# Patient Record
Sex: Male | Born: 2000 | Hispanic: Refuse to answer | Marital: Single | Smoking: Current every day smoker
Health system: Southern US, Community
[De-identification: ages and names within clinical notes are randomized; demographics above are authoritative.]

## PROBLEM LIST (undated history)

## (undated) DIAGNOSIS — H7291 Unspecified perforation of tympanic membrane, right ear: Principal | ICD-10-CM

## (undated) DIAGNOSIS — M9251 Juvenile osteochondrosis of tibia and fibula, right leg: Secondary | ICD-10-CM

## (undated) DIAGNOSIS — S82891A Other fracture of right lower leg, initial encounter for closed fracture: Secondary | ICD-10-CM

## (undated) DIAGNOSIS — H6691 Otitis media, unspecified, right ear: Secondary | ICD-10-CM

## (undated) DIAGNOSIS — J189 Pneumonia, unspecified organism: Secondary | ICD-10-CM

## (undated) DIAGNOSIS — J4520 Mild intermittent asthma, uncomplicated: Secondary | ICD-10-CM

## (undated) HISTORY — DX: Unspecified perforation of tympanic membrane, right ear: H72.91

## (undated) HISTORY — DX: Pneumonia, unspecified organism: J18.9

## (undated) HISTORY — DX: Otitis media, unspecified, right ear: H66.91

## (undated) HISTORY — DX: Other fracture of right lower leg, initial encounter for closed fracture: S82.891A

## (undated) HISTORY — DX: Juvenile osteochondrosis of tibia and fibula, right leg: M92.51

## (undated) HISTORY — DX: Mild intermittent asthma, uncomplicated: J45.20

---
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
## 2021-10-24 ENCOUNTER — Encounter: Payer: Self-pay | Admitting: Family Medicine

## 2021-10-24 ENCOUNTER — Ambulatory Visit (INDEPENDENT_AMBULATORY_CARE_PROVIDER_SITE_OTHER): Payer: BC Managed Care – PPO | Admitting: Family Medicine

## 2021-10-24 VITALS — BP 120/68 | HR 81 | Temp 97.2°F | Ht 68.75 in | Wt 196.0 lb

## 2021-10-24 DIAGNOSIS — Z0000 Encounter for general adult medical examination without abnormal findings: Secondary | ICD-10-CM

## 2021-10-24 DIAGNOSIS — Z13220 Encounter for screening for lipoid disorders: Secondary | ICD-10-CM

## 2021-10-24 DIAGNOSIS — Z00129 Encounter for routine child health examination without abnormal findings: Secondary | ICD-10-CM

## 2021-10-24 DIAGNOSIS — E663 Overweight: Secondary | ICD-10-CM | POA: Diagnosis not present

## 2021-10-24 DIAGNOSIS — Z131 Encounter for screening for diabetes mellitus: Secondary | ICD-10-CM

## 2021-10-24 DIAGNOSIS — Z113 Encounter for screening for infections with a predominantly sexual mode of transmission: Secondary | ICD-10-CM

## 2021-10-24 DIAGNOSIS — J4520 Mild intermittent asthma, uncomplicated: Secondary | ICD-10-CM | POA: Diagnosis not present

## 2021-10-24 DIAGNOSIS — Z1159 Encounter for screening for other viral diseases: Secondary | ICD-10-CM

## 2021-10-24 MED ORDER — ALBUTEROL SULFATE HFA 108 (90 BASE) MCG/ACT IN AERS: 2.0000 | INHALATION_SPRAY | Freq: Four times a day (QID) | RESPIRATORY_TRACT | 6 refills | Status: AC | PRN

## 2021-10-24 NOTE — Assessment & Plan Note (Addendum)
Asthma is well controlled on PRN albuterol inhaler, rare use - refilled.  AIR-Q score = 0 Peak flow = 600 L/min, 93% predicted

## 2021-10-24 NOTE — Assessment & Plan Note (Addendum)
Congratulated on 30+ lb weight loss over the past 2 years.  He stays more active.  Anticipate this has helped asthma control.

## 2021-10-24 NOTE — Patient Instructions (Signed)
Return at your convenience for fasting labs (like next Monday).  You are doing well today Congrats on weight!  Albuterol refilled but it sounds like your asthma is well controlled. Peak flow test today.  Return as needed or in 1 year for next physical.   Preventive Care 39-20 Years Old, Male Preventive care refers to lifestyle choices and visits with your health care provider that can promote health and wellness. At this stage in your life, you may start seeing a primary care physician instead of a pediatrician for your preventive care. Preventive care visits are also called wellness exams. What can I expect for my preventive care visit? Counseling During your preventive care visit, your health care provider may ask about your: Medical history, including: Past medical problems. Family medical history. Current health, including: Home life and relationship well-being. Emotional well-being. Sexual activity and sexual health. Lifestyle, including: Alcohol, nicotine or tobacco, and drug use. Access to firearms. Diet, exercise, and sleep habits. Sunscreen use. Motor vehicle safety. Physical exam Your health care provider may check your: Height and weight. These may be used to calculate your BMI (body mass index). BMI is a measurement that tells if you are at a healthy weight. Waist circumference. This measures the distance around your waistline. This measurement also tells if you are at a healthy weight and may help predict your risk of certain diseases, such as type 2 diabetes and high blood pressure. Heart rate and blood pressure. Body temperature. Skin for abnormal spots. What immunizations do I need?  Vaccines are usually given at various ages, according to a schedule. Your health care provider will recommend vaccines for you based on your age, medical history, and lifestyle or other factors, such as travel or where you work. What tests do I need? Screening Your health care  provider may recommend screening tests for certain conditions. This may include: Vision and hearing tests. Lipid and cholesterol levels. Hepatitis B test. Hepatitis C test. HIV (human immunodeficiency virus) test. STI (sexually transmitted infection) testing, if you are at risk. Tuberculosis skin test. Talk with your health care provider about your test results, treatment options, and if necessary, the need for more tests. Follow these instructions at home: Eating and drinking  Eat a healthy diet that includes fresh fruits and vegetables, whole grains, lean protein, and low-fat dairy products. Drink enough fluid to keep your urine pale yellow. Do not drink alcohol if: Your health care provider tells you not to drink. You are under the legal drinking age. In the [place] the legal drinking age is 20. If you drink alcohol: Limit how much you have to 0-2 drinks a day. Know how much alcohol is in your drink. In the [place] one drink equals one 12 oz bottle of beer (355 mL), one 5 oz glass of wine (148 mL), or one 1 oz glass of hard liquor (44 mL). Lifestyle Brush your teeth every morning and night with fluoride toothpaste. Floss one time each day. Exercise for at least 30 minutes 5 or more days of the week. Do not use any products that contain nicotine or tobacco. These products include cigarettes, chewing tobacco, and vaping devices, such as e-cigarettes. If you need help quitting, ask your health care provider. Do not use drugs. If you are sexually active, practice safe sex. Use a condom or other form of protection to prevent STIs. Find healthy ways to manage stress, such as: Meditation, yoga, or listening to music. Journaling. Talking to a trusted person. Spending time  with friends and family. Safety Always wear your seat belt while driving or riding in a vehicle. Do not drive: If you have been drinking alcohol. Do not ride with someone who has been drinking. When you are tired or  distracted. While texting. If you have been using any mind-altering substances or drugs. Wear a helmet and other protective equipment during sports activities. If you have firearms in your house, make sure you follow all gun safety procedures. Seek help if you have been bullied, physically abused, or sexually abused. Use the internet responsibly to avoid dangers, such as online bullying and online sex predators. What's next? Go to your health care provider once a year for an annual wellness visit. Ask your health care provider how often you should have your eyes and teeth checked. Stay up to date on all vaccines. This information is not intended to replace advice given to you by your health care provider. Make sure you discuss any questions you have with your health care provider. Document Revised: 10/06/2020 Document Reviewed: 10/06/2020 Elsevier Patient Education  2023 [employer].

## 2021-10-24 NOTE — Assessment & Plan Note (Signed)
Preventative protocols reviewed and updated unless pt declined. Discussed healthy diet and lifestyle.  

## 2021-10-24 NOTE — Progress Notes (Signed)
Patient ID: Jeff Mcclure, male    DOB: 12/29/2000, 21 y.o.   MRN: [id number]  This visit was conducted in person.  BP 120/68   Pulse 81   Temp (!) 97.2 F (36.2 C) (Temporal)   Ht 5' 8.75" (1.746 m)   Wt 196 lb (88.9 kg)   SpO2 97%   PF 600 L/min Comment: 1- 300, 2- 500, 3- 600  BMI 29.16 kg/m   Anticipated PF for age/height = ~640 L/min  CC: CPE  Subjective:   HPI: Jeff Mcclure is a 21 y.o. male presenting on 10/24/2021 for Annual Exam   Last seen 09/2019.   Mild intermittent asthma - managed with PRN albuterol inh/neb. No recent albuterol use. Unsure triggers. No night time awakenings.  AIR-Q score = 0!  Did 1 semester at [employer] and [employer] in [place] [employer]). Now working at [employer].   Smoking MJ daily.   30 lb weight loss! Enjoys basketball, goes to gym regularly.  New car [employer]: Flu shot yearly  COVID vaccine [employer] 07/2019 x2  Tdap 2014 Seat belt use discussed. Doesn't drive and text.  Sunscreen use discussed. No changing moles on skin.  Alcohol - none Smoking/vaping/smokeless tobacco - none Recreational drugs - MJ  Mood - denies depression.   Sex - sexually active with 1 partner in the past year.  Dentist - due  Eye exam - due  Lives with mom and grandmother Father in [place] Will attend [employer] College at [employer] football, bikes, swimming, video games. No smokers at home. Wears helmet. Favorite food - rice and beans.  Good fruits and vegetables.       Relevant past medical, surgical, family and social history reviewed and updated as indicated. Interim medical history since our last visit reviewed. Allergies and medications reviewed and updated. Outpatient Medications Prior to Visit  Medication Sig Dispense Refill   albuterol (ACCUNEB) 0.63 MG/3ML nebulizer solution INHALE 1 VIAL BY NEBULIZATION EVERY 6 (SIX) HOURS AS NEEDED. 75 mL 2   albuterol (VENTOLIN HFA) 108 (90 Base) MCG/ACT inhaler TAKE 2 PUFFS  BY MOUTH EVERY 6 HOURS AS NEEDED 8.5 each 3   benzonatate (TESSALON) 100 MG capsule Take 1 capsule (100 mg total) by mouth 3 (three) times daily as needed. 30 capsule 0   ipratropium (ATROVENT) 0.03 % nasal spray Place 2 sprays into both nostrils every 12 (twelve) hours. 30 mL 12   predniSONE (STERAPRED UNI-PAK 21 TAB) 10 MG (21) TBPK tablet 6 day taper; take as directed on package instructions 21 tablet 0   No facility-administered medications prior to visit.     Per HPI unless specifically indicated in ROS section below Review of Systems  Constitutional:  Negative for activity change, appetite change, chills, fatigue, fever and unexpected weight change.  HENT:  Negative for hearing loss.   Eyes:  Negative for visual disturbance.  Respiratory:  Positive for cough (recent cold). Negative for chest tightness, shortness of breath and wheezing.   Cardiovascular:  Negative for chest pain, palpitations and leg swelling.  Gastrointestinal:  Negative for abdominal distention, abdominal pain, blood in stool, constipation, diarrhea, nausea and vomiting.  Genitourinary:  Negative for difficulty urinating and hematuria.  Musculoskeletal:  Negative for arthralgias, myalgias and neck pain.  Skin:  Negative for rash.  Neurological:  Negative for dizziness, seizures, syncope and headaches.  Hematological:  Negative for adenopathy. Does not bruise/bleed easily.  Psychiatric/Behavioral:  Negative for dysphoric mood. The patient is not nervous/anxious.  Objective:  BP 120/68   Pulse 81   Temp (!) 97.2 F (36.2 C) (Temporal)   Ht 5' 8.75" (1.746 m)   Wt 196 lb (88.9 kg)   SpO2 97%   PF 600 L/min Comment: 1- 300, 2- 500, 3- 600  BMI 29.16 kg/m   Wt Readings from Last 3 Encounters:  10/24/21 196 lb (88.9 kg) (90 %, Z= 1.29)*  10/22/19 229 lb 4 oz (104 kg) (98 %, Z= 2.16)*  09/30/19 230 lb 2 oz (104.4 kg) (99 %, Z= 2.18)*   * Growth percentiles are based on CDC (Boys, 2-20 Years) data.       Physical Exam Vitals and nursing note reviewed.  Constitutional:      General: He is not in acute distress.    Appearance: Normal appearance. He is well-developed. He is not ill-appearing.  HENT:     Head: Normocephalic and atraumatic.     Right Ear: Hearing, tympanic membrane, ear canal and external ear normal.     Left Ear: Hearing, tympanic membrane, ear canal and external ear normal.  Eyes:     General: No scleral icterus.    Extraocular Movements: Extraocular movements intact.     Conjunctiva/sclera: Conjunctivae normal.     Pupils: Pupils are equal, round, and reactive to light.  Neck:     Thyroid: No thyroid mass or thyromegaly.  Cardiovascular:     Rate and Rhythm: Normal rate and regular rhythm.     Pulses: Normal pulses.          Radial pulses are 2+ on the right side and 2+ on the left side.     Heart sounds: Normal heart sounds. No murmur heard. Pulmonary:     Effort: Pulmonary effort is normal. No respiratory distress.     Breath sounds: Normal breath sounds. No wheezing, rhonchi or rales.  Abdominal:     General: Bowel sounds are normal. There is no distension.     Palpations: Abdomen is soft. There is no mass.     Tenderness: There is no abdominal tenderness. There is no guarding or rebound.     Hernia: No hernia is present.  Musculoskeletal:        General: Normal range of motion.     Cervical back: Normal range of motion and neck supple.     Right lower leg: No edema.     Left lower leg: No edema.  Lymphadenopathy:     Cervical: No cervical adenopathy.  Skin:    General: Skin is warm and dry.     Findings: No rash.  Neurological:     General: No focal deficit present.     Mental Status: He is alert and oriented to person, place, and time.  Psychiatric:        Mood and Affect: Mood normal.        Behavior: Behavior normal.        Thought Content: Thought content normal.        Judgment: Judgment normal.       Assessment & Plan:   Problem List  Items Addressed This Visit     Well adolescent visit - Primary (Chronic)    Preventative protocols reviewed and updated unless pt declined. Discussed healthy diet and lifestyle.       Mild intermittent asthma    Asthma is well controlled on PRN albuterol inhaler, rare use - refilled.  AIR-Q score = 0 Peak flow = 600 L/min, 93% predicted  Relevant Medications   albuterol (VENTOLIN HFA) 108 (90 Base) MCG/ACT inhaler   Overweight with body mass index (BMI) 25.0-29.9    Congratulated on 30+ lb weight loss over the past 2 years.  He stays more active.  Anticipate this has helped asthma control.       Other Visit Diagnoses     Screen for STD (sexually transmitted disease)       Relevant Orders   HIV Antibody (routine testing w rflx)   RPR   C. trachomatis/N. gonorrhoeae RNA   Need for hepatitis C screening test       Relevant Orders   Hepatitis C antibody   Lipid screening       Relevant Orders   Lipid panel   Diabetes mellitus screening       Relevant Orders   Basic metabolic panel        Meds ordered this encounter  Medications   albuterol (VENTOLIN HFA) 108 (90 Base) MCG/ACT inhaler    Sig: Inhale 2 puffs into the lungs every 6 (six) hours as needed for wheezing or shortness of breath.    Dispense:  8.5 each    Refill:  6   Orders Placed This Encounter  Procedures   C. trachomatis/N. gonorrhoeae RNA    Standing Status:   Future    Standing Expiration Date:   10/25/2022   Lipid panel    Standing Status:   Future    Standing Expiration Date:   10/25/2022   Basic metabolic panel    Standing Status:   Future    Standing Expiration Date:   10/25/2022   Hepatitis C antibody    Standing Status:   Future    Standing Expiration Date:   10/25/2022   HIV Antibody (routine testing w rflx)    Standing Status:   Future    Standing Expiration Date:   10/25/2022   RPR    Standing Status:   Future    Standing Expiration Date:   10/25/2022     Patient instructions: Return  at your convenience for fasting labs (like next Monday).  You are doing well today Congrats on weight!  Albuterol refilled but it sounds like your asthma is well controlled. Peak flow test today.  Return as needed or in 1 year for next physical.   Follow up plan: Return in about 1 year (around 10/25/2022) for annual exam, prior fasting for blood work.  Eustaquio Boyden, MD

## 2021-10-31 ENCOUNTER — Other Ambulatory Visit (INDEPENDENT_AMBULATORY_CARE_PROVIDER_SITE_OTHER): Payer: BC Managed Care – PPO

## 2021-10-31 DIAGNOSIS — Z1159 Encounter for screening for other viral diseases: Secondary | ICD-10-CM

## 2021-10-31 DIAGNOSIS — Z13220 Encounter for screening for lipoid disorders: Secondary | ICD-10-CM

## 2021-10-31 DIAGNOSIS — Z113 Encounter for screening for infections with a predominantly sexual mode of transmission: Secondary | ICD-10-CM | POA: Diagnosis not present

## 2021-10-31 DIAGNOSIS — Z131 Encounter for screening for diabetes mellitus: Secondary | ICD-10-CM | POA: Diagnosis not present

## 2021-10-31 LAB — BASIC METABOLIC PANEL
BUN: 15 mg/dL (ref 6–23)
CO2: 28 mEq/L (ref 19–32)
Calcium: 9.7 mg/dL (ref 8.4–10.5)
Chloride: 102 mEq/L (ref 96–112)
Creatinine, Ser: 0.96 mg/dL (ref 0.40–1.50)
GFR: 114.23 mL/min (ref >60.00)
Glucose, Bld: 81 mg/dL (ref 70–99)
Potassium: 3.5 mEq/L (ref 3.5–5.1)
Sodium: 139 mEq/L (ref 135–145)

## 2021-10-31 LAB — LIPID PANEL
Cholesterol: 154 mg/dL (ref 0–200)
HDL: 55.9 mg/dL (ref >39.00)
LDL Cholesterol: 83 mg/dL (ref 0–99)
NonHDL: 98.17
Total CHOL/HDL Ratio: 3
Triglycerides: 75 mg/dL (ref 0.0–149.0)
VLDL: 15 mg/dL (ref 0.0–40.0)

## 2021-11-01 LAB — HEPATITIS C ANTIBODY: Hepatitis C Ab: NONREACTIVE

## 2021-11-01 LAB — C. TRACHOMATIS/N. GONORRHOEAE RNA
C. trachomatis RNA, TMA: NOT DETECTED
N. gonorrhoeae RNA, TMA: NOT DETECTED

## 2021-11-01 LAB — RPR: RPR Ser Ql: NONREACTIVE

## 2021-11-01 LAB — HIV ANTIBODY (ROUTINE TESTING W REFLEX): HIV 1&2 Ab, 4th Generation: NONREACTIVE

## 2021-11-07 ENCOUNTER — Emergency Department
Admission: EM | Admit: 2021-11-07 | Discharge: 2021-11-07 | Disposition: A | Discharge status: Home / Self Care | Payer: BC Managed Care – PPO | Attending: Emergency Medicine | Admitting: Emergency Medicine

## 2021-11-07 ENCOUNTER — Other Ambulatory Visit: Payer: Self-pay

## 2021-11-07 DIAGNOSIS — Z7951 Long term (current) use of inhaled steroids: Secondary | ICD-10-CM | POA: Insufficient documentation

## 2021-11-07 DIAGNOSIS — J029 Acute pharyngitis, unspecified: Secondary | ICD-10-CM | POA: Diagnosis present

## 2021-11-07 DIAGNOSIS — R0981 Nasal congestion: Secondary | ICD-10-CM | POA: Diagnosis not present

## 2021-11-07 DIAGNOSIS — J4520 Mild intermittent asthma, uncomplicated: Secondary | ICD-10-CM | POA: Insufficient documentation

## 2021-11-07 DIAGNOSIS — J039 Acute tonsillitis, unspecified: Secondary | ICD-10-CM | POA: Insufficient documentation

## 2021-11-07 LAB — GROUP A STREP BY PCR: Group A Strep by PCR: NOT DETECTED

## 2021-11-07 MED ORDER — CLINDAMYCIN HCL 300 MG PO CAPS: 300.0000 mg | ORAL_CAPSULE | Freq: Three times a day (TID) | ORAL | 0 refills | Status: DC

## 2021-11-07 MED ORDER — DEXAMETHASONE 10 MG/ML FOR PEDIATRIC ORAL USE
10.0000 mg | Freq: Once | INTRAMUSCULAR | Status: AC
Start: 2021-11-07 — End: 2021-11-07
  Administered 2021-11-07: 10 mg via ORAL
  Filled 2021-11-07: qty 1

## 2021-11-07 MED ORDER — MAGIC MOUTHWASH: ORAL | 0 refills | Status: DC

## 2021-11-07 MED ORDER — CLINDAMYCIN HCL 150 MG PO CAPS
300.0000 mg | ORAL_CAPSULE | Freq: Once | ORAL | Status: AC
Start: 2021-11-07 — End: 2021-11-07
  Administered 2021-11-07: 300 mg via ORAL
  Filled 2021-11-07: qty 2

## 2021-11-07 MED ORDER — MAGIC MOUTHWASH
10.0000 mL | Freq: Once | ORAL | Status: AC
  Administered 2021-11-07: 10 mL via ORAL
  Filled 2021-11-07: qty 10

## 2021-11-07 MED ORDER — ACETAMINOPHEN 325 MG PO TABS
650.0000 mg | ORAL_TABLET | Freq: Once | ORAL | Status: AC | PRN
  Administered 2021-11-07: 650 mg via ORAL
  Filled 2021-11-07: qty 2

## 2021-11-07 NOTE — Discharge Instructions (Signed)
1.  Take antibiotic as prescribed (Clindamycin 300mg   3 times daily x10 days). 2.  You may use Magic mouthwash as needed for throat discomfort. 3.  Return to the ER for worsening symptoms, persistent vomiting, difficulty breathing or other concerns.

## 2021-11-07 NOTE — ED Triage Notes (Signed)
Patient reports three days of sore throat and nasal congestion. Denies fever or chills, denies body aches. Ambulatory to triage, resp even, unlabored on RA.

## 2021-11-07 NOTE — ED Provider Notes (Signed)
[facility] Provider Note    Event Date/Time   First MD Initiated Contact with Patient 11/07/21 [phone]     (approximate)   History   Sore Throat   HPI  Jeff Mcclure is a 21 y.o. male who presents to the ED from home with a chief complaint of sore throat and nasal congestion x3 days.  Denies fever, chills, cough, chest pain, shortness of breath, abdominal pain, nausea, vomiting or dizziness.  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 Authorizing Provider  clindamycin (CLEOCIN) 300 MG capsule Take 1 capsule (300 mg total) by mouth 3 (three) times daily. 11/07/21  Yes Irean Hong, MD  magic mouthwash SOLN 30mL Anbesol 2mL Benadryl 38mL Mylanta  66mL swish, gargle & spit q8hr prn throat discomfort 11/07/21  Yes Irean Hong, MD  albuterol (ACCUNEB) 0.63 MG/3ML nebulizer solution INHALE 1 VIAL BY NEBULIZATION EVERY 6 (SIX) HOURS AS NEEDED.  09/30/19   Eustaquio Boyden, MD  albuterol (VENTOLIN HFA) 108 (90 Base) MCG/ACT inhaler Inhale 2 puffs into the lungs every 6 (six) hours as needed for wheezing or shortness of breath. 10/24/21   Eustaquio Boyden, MD     Allergies  Cinnamomum aromaticum [cinnamon], Aspirin, and Penicillins   Family History   Family History  Problem Relation Age of Onset   Asthma Mother    Hypothyroidism Mother    Hypertension Mother    Hyperlipidemia Mother    Diabetes Mother        borderline   Cancer Maternal Grandmother        thyroid   Coronary artery disease Neg Hx    Stroke Neg Hx      Physical Exam  Triage Vital Signs: ED Triage Vitals [11/07/21 0212]  Enc Vitals Group     BP 134/70     Pulse Rate 95     Resp 20     Temp 98.3 F (36.8 C)     Temp Source Oral     SpO2 97 %     Weight 196 lb (88.9 kg)     Height 5\' 8"  (1.727 m)     Head Circumference      Peak Flow      Pain Score 10     Pain Loc      Pain Edu?      Excl. in GC?  Updated Vital Signs: BP 134/70 (BP Location: Left Arm)   Pulse 95   Temp 98.3 F (36.8 C) (Oral)   Resp 20   Ht 5\' 8"  (1.727 m)   Wt 88.9 kg   SpO2 97%   BMI 29.80 kg/m    General: Awake, no distress.  CV:  Good peripheral perfusion.  Resp:  Normal effort.  Abd:  No distention.  Other:  Moderately erythematous oropharynx with symmetrical and bilateral tonsillar swelling without exudates or peritonsillar abscess.  There is no hoarse or muffled voice.  There is no drooling.  Shotty anterior lymphadenopathy.  Supple neck without meningismus.  No rash.   ED Results / Procedures / Treatments  Labs (all labs ordered are listed, but only abnormal results are displayed) Labs Reviewed  GROUP A STREP BY PCR     EKG  None   RADIOLOGY None   Official radiology report(s): No results found.   PROCEDURES:  Critical Care performed: No  Procedures   MEDICATIONS ORDERED IN ED: Medications  dexamethasone (DECADRON) 10 MG/ML  injection for Pediatric ORAL use 10 mg (has no administration in time range)  clindamycin (CLEOCIN) capsule 300 mg (has no administration in time range)  magic mouthwash (has no administration in time range)  acetaminophen (TYLENOL) tablet 650 mg (650 mg Oral Given 11/07/21 0217)     IMPRESSION / MDM / ASSESSMENT AND PLAN / ED COURSE  I reviewed the triage vital signs and the nursing notes.                             21 year old male presenting with sore throat.  Rapid strep is negative.  Clinically patient has tonsillitis.  Will start clindamycin, Decadron x1 dose in the ED, Magic mouthwash as needed.  Strict return precautions given.  Patient verbalizes understanding and agrees with plan of care.  Patient's presentation is most consistent with acute, uncomplicated illness.   FINAL CLINICAL IMPRESSION(S) / ED DIAGNOSES   Final diagnoses:  Sore throat  Tonsillitis     Rx / DC Orders   ED Discharge Orders          Ordered    clindamycin (CLEOCIN) 300 MG capsule  3 times daily        11/07/21 0532    magic mouthwash SOLN        11/07/21 0532             Note:  This document was prepared using Dragon voice recognition software and may include unintentional dictation errors.   11/09/21, MD 11/07/21 [phone]

## 2021-11-21 ENCOUNTER — Encounter: Payer: Self-pay | Admitting: Family Medicine

## 2021-11-21 ENCOUNTER — Ambulatory Visit (INDEPENDENT_AMBULATORY_CARE_PROVIDER_SITE_OTHER): Payer: BC Managed Care – PPO | Admitting: Family Medicine

## 2021-11-21 DIAGNOSIS — J039 Acute tonsillitis, unspecified: Secondary | ICD-10-CM | POA: Diagnosis not present

## 2021-11-21 NOTE — Progress Notes (Signed)
Patient ID: Jeff Mcclure, male    DOB: 29-Jun-2000, 21 y.o.   MRN: [id number]  This visit was conducted in person.  BP 120/74   Pulse 64   Temp (!) 97.3 F (36.3 C) (Temporal)   Ht 5\' 8"  (1.727 m)   Wt 198 lb 6 oz (90 kg)   SpO2 98%   BMI 30.16 kg/m    CC: ER f/u visit  Subjective:   HPI: Jeff Mcclure is a 21 y.o. male presenting on 11/21/2021 for Hospitalization Follow-up (Seen at 11/07/21 at [facility] ED, dx ST; tonsillitis. )   Recent ER visit 11/07/2021 for ST with nasal congestion. Was very painful to swallow.  Had bilateral tonsillar swelling, shotty AC LAD, RST negative. Dx tonsillitis, treated with decadron x1 in ER as well as clindamycin 10d course. Also received magic mouthwash.   He didn't complete clindamycin course as he vomited x2 on 3rd day. Only took 3 days of antibiotic.  Symptoms have fully resolved.  Asthma well controlled. No fever, no further ST, cough or congestion.      Relevant past medical, surgical, family and social history reviewed and updated as indicated. Interim medical history since our last visit reviewed. Allergies and medications reviewed and updated. Outpatient Medications Prior to Visit  Medication Sig Dispense Refill   albuterol (ACCUNEB) 0.63 MG/3ML nebulizer solution INHALE 1 VIAL BY NEBULIZATION EVERY 6 (SIX) HOURS AS NEEDED. 75 mL 2   albuterol (VENTOLIN HFA) 108 (90 Base) MCG/ACT inhaler Inhale 2 puffs into the lungs every 6 (six) hours as needed for wheezing or shortness of breath. 8.5 each 6   magic mouthwash SOLN 48mL Anbesol 34mL Benadryl 61mL Mylanta  13mL swish, gargle & spit q8hr prn throat discomfort 72 mL 0   clindamycin (CLEOCIN) 300 MG capsule Take 1 capsule (300 mg total) by mouth 3 (three) times daily. 30 capsule 0   No facility-administered medications prior to visit.     Per HPI unless specifically indicated in ROS section below Review of Systems  Objective:  BP 120/74   Pulse 64   Temp (!) 97.3 F (36.3 C)  (Temporal)   Ht 5\' 8"  (1.727 m)   Wt 198 lb 6 oz (90 kg)   SpO2 98%   BMI 30.16 kg/m   Wt Readings from Last 3 Encounters:  11/21/21 198 lb 6 oz (90 kg)  11/07/21 196 lb (88.9 kg) (90 %, Z= 1.29)*  10/24/21 196 lb (88.9 kg) (90 %, Z= 1.29)*   * Growth percentiles are based on CDC (Boys, 2-20 Years) data.      Physical Exam Vitals and nursing note reviewed.  Constitutional:      Appearance: Normal appearance. He is not ill-appearing.  HENT:     Head: Normocephalic and atraumatic.     Right Ear: Hearing, tympanic membrane, ear canal and external ear normal. There is no impacted cerumen.     Left Ear: Hearing, tympanic membrane, ear canal and external ear normal. There is no impacted cerumen.     Nose: Nose normal. No mucosal edema or rhinorrhea.     Right Turbinates: Not enlarged or swollen.     Left Turbinates: Not enlarged or swollen.     Right Sinus: No maxillary sinus tenderness or frontal sinus tenderness.     Left Sinus: No maxillary sinus tenderness or frontal sinus tenderness.     Mouth/Throat:     Mouth: Mucous membranes are moist.     Pharynx: Oropharynx is clear. No oropharyngeal  exudate or posterior oropharyngeal erythema.  Eyes:     Extraocular Movements: Extraocular movements intact.     Conjunctiva/sclera: Conjunctivae normal.     Pupils: Pupils are equal, round, and reactive to light.  Cardiovascular:     Rate and Rhythm: Normal rate and regular rhythm.     Pulses: Normal pulses.     Heart sounds: Normal heart sounds. No murmur heard. Pulmonary:     Effort: Pulmonary effort is normal. No respiratory distress.     Breath sounds: Normal breath sounds. No wheezing, rhonchi or rales.  Musculoskeletal:     Cervical back: Normal range of motion and neck supple. No rigidity.     Right lower leg: No edema.     Left lower leg: No edema.  Lymphadenopathy:     Cervical: No cervical adenopathy.  Skin:    General: Skin is warm and dry.     Findings: No rash.   Neurological:     Mental Status: He is alert.  Psychiatric:        Mood and Affect: Mood normal.        Behavior: Behavior normal.       Results for orders placed or performed during the hospital encounter of 11/07/21  Group A Strep by PCR   Specimen: Throat; Sterile Swab  Result Value Ref Range   Group A Strep by PCR NOT DETECTED NOT DETECTED    Assessment & Plan:   Problem List Items Addressed This Visit     Tonsillitis    Treated for strep by ER with decadron and clindamycin, but only took 3 days worth of antibiotic. Symptoms have largely resolved.         No orders of the defined types were placed in this encounter.  No orders of the defined types were placed in this encounter.    Patient Instructions  You are doing well today. Let us know if recurrent symptoms.   Follow up plan: Return if symptoms worsen or fail to improve.  Jeff Boyden, MD

## 2021-11-21 NOTE — Patient Instructions (Addendum)
You are doing well today. Let us know if recurrent symptoms.

## 2021-11-21 NOTE — Assessment & Plan Note (Signed)
Treated for strep by ER with decadron and clindamycin, but only took 3 days worth of antibiotic. Symptoms have largely resolved.

## 2022-06-06 ENCOUNTER — Ambulatory Visit (INDEPENDENT_AMBULATORY_CARE_PROVIDER_SITE_OTHER): Payer: BC Managed Care – PPO | Admitting: Family Medicine

## 2022-06-06 ENCOUNTER — Encounter: Payer: Self-pay | Admitting: Family Medicine

## 2022-06-06 VITALS — BP 104/68 | HR 96 | Temp 97.1°F | Ht 68.0 in | Wt 206.4 lb

## 2022-06-06 DIAGNOSIS — U071 COVID-19: Secondary | ICD-10-CM | POA: Diagnosis not present

## 2022-06-06 DIAGNOSIS — R051 Acute cough: Secondary | ICD-10-CM | POA: Diagnosis not present

## 2022-06-06 HISTORY — DX: COVID-19: U07.1

## 2022-06-06 LAB — POCT INFLUENZA A/B
Influenza A, POC: NEGATIVE
Influenza B, POC: NEGATIVE

## 2022-06-06 LAB — POC COVID19 BINAXNOW: SARS Coronavirus 2 Ag: POSITIVE — AB

## 2022-06-06 MED ORDER — FLUTICASONE PROPIONATE 50 MCG/ACT NA SUSP: 2.0000 | Freq: Every day | NASAL | 1 refills | Status: DC

## 2022-06-06 NOTE — Progress Notes (Signed)
Subjective:    Patient ID: Jeff Mcclure, male    DOB: 02/01/2001, 22 y.o.   MRN: [id number]  HPI 22 yo pf of Dr Darnell Level presents with uri symptoms  He is a smoker   Wt Readings from Last 3 Encounters:  06/06/22 206 lb 6 oz (93.6 kg)  11/21/21 198 lb 6 oz (90 kg)  11/07/21 196 lb (88.9 kg) (90 %, Z= 1.29)*   * Growth percentiles are based on CDC (Boys, 2-20 Years) data.   31.38 kg/m   Pos covid test today in office  Yesterday afternoon got a runny nose  This am had low grade temp -sweating , hot and cold 99.9   Slight headache Throat is fine  Ears are ok  Nasal congestion   Cough- little/not bad  Non prod   No wheezing  Has not needed his albuterol    No loss of taste or smell   Otc: Took [employer] generic multi symptom/ day quil  Used fonase in the past/not now  Does well with ibuprofen   Does not have saline   Results for orders placed or performed in visit on 06/06/22  POC COVID-19  Result Value Ref Range   SARS Coronavirus 2 Ag Positive (A) Negative  POCT Influenza A/B  Result Value Ref Range   Influenza A, POC Negative Negative   Influenza B, POC Negative Negative   Patient Active Problem List   Diagnosis Date Noted   COVID-19 06/06/2022   Keloid [id number]   Complication of ear piercing 01/09/2018   Constipation 04/12/2016   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   Tonsillitis 08/23/2015   Allergic rhinitis 06/11/2015   Overweight with body mass index (BMI) 25.0-29.9 05/12/2014   Osgood-Schlatter's disease of right knee 01/09/2013   Skin rash 05/29/2011   Well adolescent visit 12/13/2010   Mild intermittent asthma    Past Medical History:  Diagnosis Date   Acute otitis media of right ear with perforated tympanic membrane 04/21/2015   Ankle fracture, right 10/08/2012   [place] fracture of distal tibial epiphysis s/p boot   Mild intermittent asthma    Osgood-Schlatter's disease of right knee 01/09/2013   Completed [facility] PT 01/2014 with HEP  in place    Pneumonia 12/2010   Past Surgical History:  Procedure Laterality Date   TYMPANOSTOMY Bilateral 2011   Social History   Tobacco Use   Smoking status: Every Day    Types: Cigarettes    Passive exposure: Yes   Smokeless tobacco: Never   Tobacco comments:    Marijuana  Substance Use Topics   Alcohol use: No    Alcohol/week: 0.0 standard drinks of alcohol   Drug use: Yes    Types: Marijuana   Family History  Problem Relation Age of Onset   Asthma Mother    Hypothyroidism Mother    Hypertension Mother    Hyperlipidemia Mother    Diabetes Mother        borderline   Cancer Maternal Grandmother        thyroid   Coronary artery disease Neg Hx    Stroke Neg Hx    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   Aspirin Nausea And Vomiting   Penicillins Nausea And Vomiting   Current Outpatient Medications on File Prior to Visit  Medication Sig Dispense Refill   albuterol (ACCUNEB) 0.63 MG/3ML nebulizer solution INHALE 1 VIAL BY NEBULIZATION EVERY 6 (SIX) HOURS AS NEEDED. 75 mL 2   albuterol (  VENTOLIN HFA) 108 (90 Base) MCG/ACT inhaler Inhale 2 puffs into the lungs every 6 (six) hours as needed for wheezing or shortness of breath. 8.5 each 6   magic mouthwash SOLN 59m Anbesol 347mBenadryl 3071mylanta  5mL30mish, gargle & spit q8hr prn throat discomfort 72 mL 0   No current facility-administered medications on file prior to visit.     Review of Systems  Constitutional:  Positive for appetite change, fatigue and fever.       Low grade temp  HENT:  Positive for congestion, postnasal drip, rhinorrhea and sneezing. Negative for ear pain, sinus pressure, sore throat, trouble swallowing and voice change.   Eyes:  Negative for pain and discharge.  Respiratory:  Positive for cough. Negative for shortness of breath, wheezing and stridor.   Cardiovascular:  Negative for chest pain.  Gastrointestinal:  Negative for diarrhea, nausea and  vomiting.  Genitourinary:  Negative for frequency, hematuria and urgency.  Musculoskeletal:  Negative for arthralgias and myalgias.  Skin:  Negative for rash.  Neurological:  Positive for headaches. Negative for dizziness, weakness and light-headedness.  Psychiatric/Behavioral:  Negative for confusion and dysphoric mood.        Objective:   Physical Exam Constitutional:      General: He is not in acute distress.    Appearance: Normal appearance. He is well-developed. He is obese. He is not ill-appearing, toxic-appearing or diaphoretic.  HENT:     Head: Normocephalic and atraumatic.     Comments: Nares are injected and congested      Right Ear: Tympanic membrane, ear canal and external ear normal.     Left Ear: Tympanic membrane, ear canal and external ear normal.     Nose: Congestion and rhinorrhea present.     Mouth/Throat:     Mouth: Mucous membranes are moist.     Pharynx: Oropharynx is clear. No oropharyngeal exudate or posterior oropharyngeal erythema.     Comments: Clear pnd  Eyes:     General:        Right eye: No discharge.        Left eye: No discharge.     Conjunctiva/sclera: Conjunctivae normal.     Pupils: Pupils are equal, round, and reactive to light.  Cardiovascular:     Rate and Rhythm: Tachycardia present.     Heart sounds: Normal heart sounds.  Pulmonary:     Effort: Pulmonary effort is normal. No respiratory distress.     Breath sounds: Normal breath sounds. No stridor. No wheezing, rhonchi or rales.     Comments: Good air exch No wheeze even on forced exp Chest:     Chest wall: No tenderness.  Musculoskeletal:     Cervical back: Normal range of motion and neck supple.  Lymphadenopathy:     Cervical: No cervical adenopathy.  Skin:    General: Skin is warm and dry.     Capillary Refill: Capillary refill takes less than 2 seconds.     Findings: No rash.  Neurological:     Mental Status: He is alert.     Cranial Nerves: No cranial nerve deficit.   Psychiatric:        Mood and Affect: Mood normal.           Assessment & Plan:   Problem List Items Addressed This Visit       Other   COVID-19    In healthy pt with past h/o asthma (denies flare any time lately)  Disc pros/cons of  anti viral, decided against it but need to watch closely for any asthma symptoms  Disc sympt care-see AVS Disc ER precautions  Disc isolation - 5 days min or longer for symptoms (work note given) Inst to mask an addl 10 days after that   Will watch closely for asthma symptoms   Update if not starting to improve in a week or if worsening        Other Visit Diagnoses     Acute cough    -  Primary   Relevant Orders   POC COVID-19 (Completed)   POCT Influenza A/B (Completed)

## 2022-06-06 NOTE — Assessment & Plan Note (Signed)
In healthy pt with past h/o asthma (denies flare any time lately)  Disc pros/cons of anti viral, decided against it but need to watch closely for any asthma symptoms  Disc sympt care-see AVS Disc ER precautions  Disc isolation - 5 days min or longer for symptoms (work note given) Inst to mask an addl 10 days after that   Will watch closely for asthma symptoms   Update if not starting to improve in a week or if worsening

## 2022-06-06 NOTE — Patient Instructions (Addendum)
Drink fluids and rest  mucinex DM is good for cough and congestion  Nasal saline for congestion as needed  Flonase daily helps congestion also  If you have a runny nose, an antihistamine like zyrtec is helpful  Tylenol or ibuprofen (with food)  for fever or pain or headache  Please alert [place] if symptoms worsen (if severe or short of breath please go to the ER)    Let us know if you start wheezing  Use your inhaler/breathing treatment if needed   Update if not starting to improve in a week or if worsening    Isolate for minimum of 5 days (until most of symptoms are gone)  Then you need to mask for an additional 10 days when you do go back to work

## 2022-06-28 ENCOUNTER — Other Ambulatory Visit: Payer: Self-pay | Admitting: Family Medicine

## 2022-06-28 NOTE — Telephone Encounter (Signed)
Message from pharmacy:  REQUEST FOR 90 DAYS PRESCRIPTION. 

## 2022-09-04 ENCOUNTER — Encounter: Payer: Self-pay | Admitting: Family Medicine

## 2022-09-04 ENCOUNTER — Ambulatory Visit (INDEPENDENT_AMBULATORY_CARE_PROVIDER_SITE_OTHER): Payer: BC Managed Care – PPO | Admitting: Family Medicine

## 2022-09-04 VITALS — BP 112/70 | HR 63 | Temp 97.3°F | Ht 68.0 in | Wt 200.5 lb

## 2022-09-04 DIAGNOSIS — Z8349 Family history of other endocrine, nutritional and metabolic diseases: Secondary | ICD-10-CM | POA: Diagnosis not present

## 2022-09-04 DIAGNOSIS — F4321 Adjustment disorder with depressed mood: Secondary | ICD-10-CM | POA: Insufficient documentation

## 2022-09-04 LAB — TSH: TSH: 0.77 u[IU]/mL (ref 0.35–5.50)

## 2022-09-04 NOTE — Patient Instructions (Addendum)
Work on healthy stress relieving strategies - increase exercise routine.  Thyroid blood test today  We will refer you to our counselor.  Keep physical in July.  Let me know if worsening symptoms in the interim.

## 2022-09-04 NOTE — Assessment & Plan Note (Addendum)
Mother with hypothyroidism Maternal grandmother with thyroid cancer.  Check TSH

## 2022-09-04 NOTE — Progress Notes (Signed)
Ph: [phone] Fax: [phone]   Patient ID: Jeff Mcclure, male    DOB: 04/12/2001, 22 y.o.   MRN: [id number]  This visit was conducted in person.  BP 112/70   Pulse 63   Temp (!) 97.3 F (36.3 C) (Temporal)   Ht 5\' 8"  (1.727 m)   Wt 200 lb 8 oz (90.9 kg)   SpO2 99%   BMI 30.49 kg/m    CC: discuss stressors Subjective:   HPI: Jeff Mcclure is a 22 y.o. male presenting on 09/04/2022 for Personal Problem (Wants to discuss details with Dr Reece Agar. )   Several month history depressed mood, low energy, low interest anhedonia. Appetite ok, concentration ok, mild guilt. No SI/HI. Acutely worse in the past 3 wks after GF broke up with him.   Notes R eye twitching. No headache, neck pain.  He continues exercising regularly with push ups at home, going to gym infrequently.  Stays in touch with friends.  Also enjoys watching TV and listening to music.   Continues smoking MJ daily.  He's partly spoke with mom about some of this.   Continues working at [employer].  Not going to school currently.  Wants to work in a warehouse.   No known fmhx depression.  Fmhx thyroid disease.  No heat or cold intolerance, unexpected weight changes, skin or hair changes, diarrhea or constipation.      Relevant past medical, surgical, family and social history reviewed and updated as indicated. Interim medical history since our last visit reviewed. Allergies and medications reviewed and updated. Outpatient Medications Prior to Visit  Medication Sig Dispense Refill   albuterol (ACCUNEB) 0.63 MG/3ML nebulizer solution INHALE 1 VIAL BY NEBULIZATION EVERY 6 (SIX) HOURS AS NEEDED. 75 mL 2   albuterol (VENTOLIN HFA) 108 (90 Base) MCG/ACT inhaler Inhale 2 puffs into the lungs every 6 (six) hours as needed for wheezing or shortness of breath. 8.5 each 6   fluticasone (FLONASE) 50 MCG/ACT nasal spray SPRAY 2 SPRAYS INTO EACH NOSTRIL EVERY DAY 48 mL 1   magic mouthwash SOLN 12mL Anbesol 30mL  Benadryl 30mL Mylanta  5mL swish, gargle & spit q8hr prn throat discomfort 72 mL 0   No facility-administered medications prior to visit.     Per HPI unless specifically indicated in ROS section below Review of Systems  Objective:  BP 112/70   Pulse 63   Temp (!) 97.3 F (36.3 C) (Temporal)   Ht 5\' 8"  (1.727 m)   Wt 200 lb 8 oz (90.9 kg)   SpO2 99%   BMI 30.49 kg/m   Wt Readings from Last 3 Encounters:  09/04/22 200 lb 8 oz (90.9 kg)  06/06/22 206 lb 6 oz (93.6 kg)  11/21/21 198 lb 6 oz (90 kg)      Physical Exam Vitals and nursing note reviewed.  Constitutional:      Appearance: Normal appearance. He is not ill-appearing.  HENT:     Head: Normocephalic and atraumatic.     Mouth/Throat:     Mouth: Mucous membranes are moist.     Pharynx: Oropharynx is clear. No oropharyngeal exudate or posterior oropharyngeal erythema.  Eyes:     Extraocular Movements: Extraocular movements intact.     Pupils: Pupils are equal, round, and reactive to light.  Neck:     Thyroid: Thyromegaly (mild) present. No thyroid mass or thyroid tenderness.  Cardiovascular:     Rate and Rhythm: Normal rate and regular rhythm.     Pulses: Normal pulses.  Heart sounds: Normal heart sounds. No murmur heard. Pulmonary:     Effort: Pulmonary effort is normal. No respiratory distress.     Breath sounds: No rhonchi or rales.  Musculoskeletal:     Cervical back: Normal range of motion and neck supple.     Right lower leg: No edema.     Left lower leg: No edema.  Skin:    General: Skin is warm and dry.  Neurological:     Mental Status: He is alert.  Psychiatric:        Mood and Affect: Mood normal.        Behavior: Behavior normal.       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  Support provided. Reviewed healthy stress relieving strategies. Encouraged increased physical activity.  Not consistent with MDD.  Encouraged stopping MJ use or at minimum postponing until older and brain fully developed around age 22yo, reviewed substance use effect on brain development.  Will also refer to counselor.  Reassess at CPE 10/2022.       Relevant Orders   TSH   Family history of thyroid disease in mother    Mother with hypothyroidism Maternal grandmother with thyroid cancer.  Check TSH         No orders of the defined types were placed in this encounter.   Orders Placed This Encounter  Procedures   TSH    Patient Instructions  Work on healthy stress relieving strategies - increase exercise routine.  Thyroid blood test today  We will refer you to our counselor.  Keep physical in July.  Let me know if worsening symptoms in the interim.   Follow up plan: Return if symptoms worsen or fail to improve.  Eustaquio Boyden, MD

## 2022-09-04 NOTE — Assessment & Plan Note (Signed)
Difficulty dealing with recent stressors - with breakup with GF as well as dissatisfaction with job in setting of regular marijuana use.  Support provided. Reviewed healthy stress relieving strategies. Encouraged increased physical activity.  Not consistent with MDD.  Encouraged stopping MJ use or at minimum postponing until older and brain fully developed around age 22yo, reviewed substance use effect on brain development.  Will also refer to counselor.  Reassess at CPE 10/2022.

## 2022-10-30 ENCOUNTER — Ambulatory Visit (INDEPENDENT_AMBULATORY_CARE_PROVIDER_SITE_OTHER): Payer: BC Managed Care – PPO | Admitting: Family Medicine

## 2022-10-30 ENCOUNTER — Encounter: Payer: Self-pay | Admitting: Family Medicine

## 2022-10-30 VITALS — BP 118/66 | HR 75 | Temp 97.5°F | Ht 68.75 in | Wt 198.2 lb

## 2022-10-30 DIAGNOSIS — J309 Allergic rhinitis, unspecified: Secondary | ICD-10-CM | POA: Diagnosis not present

## 2022-10-30 DIAGNOSIS — Z0000 Encounter for general adult medical examination without abnormal findings: Secondary | ICD-10-CM

## 2022-10-30 DIAGNOSIS — J4520 Mild intermittent asthma, uncomplicated: Secondary | ICD-10-CM

## 2022-10-30 NOTE — Patient Instructions (Signed)
You are doing well today. Return as needed or in 1 year for next physical 

## 2022-10-30 NOTE — Assessment & Plan Note (Addendum)
Stable without recent need for rescue inhaler

## 2022-10-30 NOTE — Assessment & Plan Note (Signed)
Continue PRN flonase.

## 2022-10-30 NOTE — Assessment & Plan Note (Signed)
Preventative protocols reviewed and updated unless pt declined. Discussed healthy diet and lifestyle.  

## 2022-10-30 NOTE — Progress Notes (Signed)
Ph: [phone] Fax: [phone]   Patient ID: Jeff Mcclure, male    DOB: 2001/04/23, 22 y.o.   MRN: [id number]  This visit was conducted in person.  BP 118/66   Pulse 75   Temp (!) 97.5 F (36.4 C) (Temporal)   Ht 5' 8.75" (1.746 m)   Wt 198 lb 4 oz (89.9 kg)   SpO2 98%   BMI 29.49 kg/m    CC: CPE Subjective:   HPI: Jeff Mcclure is a 22 y.o. male presenting on 10/30/2022 for Annual Exam   Asthma - no recent albuterol inh use.  Allergic rhinitis - continues flonase PRN  Adjustment disorder - last seen 08/2022, at that time recommended cut down on MJ and discussed counselor referral. We never sent counselor referral.   Preventative: Flu shot yearly  COVID vaccine - Pfizer 07/2019 x2  Tdap 2014 Seat belt use discussed. Doesn't drive and text.  Sunscreen use discussed. No changing moles on skin.  Sleep - averaging 6.5 hours/night Alcohol - none Smoking/vaping/smokeless tobacco - none Recreational drugs - MJ - encouraged full cessation Mood - denies depression.  Sex - sexually active with 1 partner in the past year.  Dentist - due  Eye exam - has not seen  Lives with mom and grandmother Father in [place] Will attend Middle College at [employer] football, bikes, swimming, video games. No smokers at home. Wears helmet. Favorite food - rice and beans.  Good fruits and vegetables.        Relevant past medical, surgical, family and social history reviewed and updated as indicated. Interim medical history since our last visit reviewed. Allergies and medications reviewed and updated. Outpatient Medications Prior to Visit  Medication Sig Dispense Refill   albuterol (ACCUNEB) 0.63 MG/3ML nebulizer solution INHALE 1 VIAL BY NEBULIZATION EVERY 6 (SIX) HOURS AS NEEDED. 75 mL 2   albuterol (VENTOLIN HFA) 108 (90 Base) MCG/ACT inhaler Inhale 2 puffs into the lungs every 6 (six) hours as needed for wheezing or shortness of breath. 8.5 each 6   fluticasone (FLONASE) 50 MCG/ACT  nasal spray SPRAY 2 SPRAYS INTO EACH NOSTRIL EVERY DAY 48 mL 1   No facility-administered medications prior to visit.     Per HPI unless specifically indicated in ROS section below Review of Systems  Constitutional:  Negative for activity change, appetite change, chills, fatigue, fever and unexpected weight change.  HENT:  Negative for hearing loss.   Eyes:  Negative for visual disturbance.  Respiratory:  Negative for cough, chest tightness, shortness of breath and wheezing.   Cardiovascular:  Negative for chest pain, palpitations and leg swelling.  Gastrointestinal:  Positive for abdominal pain (occ central). Negative for abdominal distention, blood in stool, constipation, diarrhea, nausea and vomiting.  Genitourinary:  Negative for difficulty urinating and hematuria.  Musculoskeletal:  Negative for arthralgias, myalgias and neck pain.  Skin:  Negative for rash.  Neurological:  Negative for dizziness, seizures, syncope and headaches.  Hematological:  Negative for adenopathy. Does not bruise/bleed easily.  Psychiatric/Behavioral:  Negative for dysphoric mood. The patient is not nervous/anxious.     Objective:  BP 118/66   Pulse 75   Temp (!) 97.5 F (36.4 C) (Temporal)   Ht 5' 8.75" (1.746 m)   Wt 198 lb 4 oz (89.9 kg)   SpO2 98%   BMI 29.49 kg/m   Wt Readings from Last 3 Encounters:  10/30/22 198 lb 4 oz (89.9 kg)  09/04/22 200 lb 8 oz (90.9 kg)  06/06/22  206 lb 6 oz (93.6 kg)      Physical Exam Vitals and nursing note reviewed.  Constitutional:      General: He is not in acute distress.    Appearance: Normal appearance. He is well-developed. He is not ill-appearing.  HENT:     Head: Normocephalic and atraumatic.     Right Ear: Hearing, tympanic membrane, ear canal and external ear normal.     Left Ear: Hearing, tympanic membrane, ear canal and external ear normal.     Nose: Nose normal.     Mouth/Throat:     Mouth: Mucous membranes are moist.     Pharynx: Oropharynx  is clear. No oropharyngeal exudate or posterior oropharyngeal erythema.  Eyes:     General: No scleral icterus.    Extraocular Movements: Extraocular movements intact.     Conjunctiva/sclera: Conjunctivae normal.     Pupils: Pupils are equal, round, and reactive to light.  Neck:     Thyroid: No thyroid mass or thyromegaly.  Cardiovascular:     Rate and Rhythm: Normal rate and regular rhythm.     Pulses: Normal pulses.          Radial pulses are 2+ on the right side and 2+ on the left side.     Heart sounds: Normal heart sounds. No murmur heard. Pulmonary:     Effort: Pulmonary effort is normal. No respiratory distress.     Breath sounds: Normal breath sounds. No wheezing, rhonchi or rales.  Abdominal:     General: Bowel sounds are normal. There is no distension.     Palpations: Abdomen is soft. There is no mass.     Tenderness: There is no abdominal tenderness. There is no guarding or rebound.     Hernia: No hernia is present.  Musculoskeletal:        General: Normal range of motion.     Cervical back: Normal range of motion and neck supple.     Right lower leg: No edema.     Left lower leg: No edema.  Lymphadenopathy:     Cervical: No cervical adenopathy.  Skin:    General: Skin is warm and dry.     Findings: No rash.  Neurological:     General: No focal deficit present.     Mental Status: He is alert and oriented to person, place, and time.  Psychiatric:        Mood and Affect: Mood normal.        Behavior: Behavior normal.        Thought Content: Thought content normal.        Judgment: Judgment normal.       Results for orders placed or performed in visit on 09/04/22  TSH  Result Value Ref Range   TSH 0.77 0.35 - 5.50 uIU/mL      10/30/2022    3:38 PM 09/04/2022   10:27 AM 10/24/2021    3:36 PM 01/09/2018   12:21 PM  Depression screen PHQ 2/9  Decreased Interest 0 2 0 0  Down, Depressed, Hopeless 0 2 0 0  PHQ - 2 Score 0 4 0 0  Altered sleeping 0 0    Tired,  decreased energy 1 2    Change in appetite 0 0    Feeling bad or failure about yourself  0 1    Trouble concentrating 0 0    Moving slowly or fidgety/restless 0 0    Suicidal thoughts 0 0  PHQ-9 Score 1 7    Difficult doing work/chores  Somewhat difficult         10/30/2022    3:39 PM 09/04/2022   10:28 AM  GAD 7 : Generalized Anxiety Score  Nervous, Anxious, on Edge 0 0  Control/stop worrying 0 0  Worry too much - different things 0 0  Trouble relaxing 0 0  Restless 0 0  Easily annoyed or irritable 1 1  Afraid - awful might happen 0 0  Total GAD 7 Score 1 1  Anxiety Difficulty  Not difficult at all   Assessment & Plan:   Problem List Items Addressed This Visit     Health maintenance examination - Primary (Chronic)    Preventative protocols reviewed and updated unless pt declined. Discussed healthy diet and lifestyle.       Mild intermittent asthma    Stable without recent need for rescue inhaler      Allergic rhinitis    Continue PRN flonase.        No orders of the defined types were placed in this encounter.   No orders of the defined types were placed in this encounter.   Patient Instructions  You are doing well today Return as needed or in 1 year for next physical.   Follow up plan: Return in about 1 year (around 10/30/2023) for annual exam.  Eustaquio Boyden, MD

## 2023-06-04 ENCOUNTER — Ambulatory Visit: Payer: Self-pay | Admitting: Family Medicine

## 2023-06-04 NOTE — Telephone Encounter (Signed)
 Copied from CRM [id number]. Topic: Clinical - Red Word Triage >> Jun 04, 2023  9:22 AM [place] E wrote: [facility] that prompted transfer to Nurse Triage: Grandmother on line for her 22 year old grandson. Patient (grandson) is experiencing cold symptoms: runny nose, crusty right eye, with "uncomfortable feeling in chest."   Chief Complaint: Cold Symptoms Symptoms: Runny nose, dry cough, right eye irritation, body aches Frequency: x 3-4 days Pertinent Negatives: Patient denies fever, nausea, vomiting, diarrhea, sore throat, difficulty breathing, chest pain body aches. Disposition: [] ED /[] Urgent Care (no appt availability in office) / [x] Appointment(In office/virtual)/ []  [facility]/ [] Home Care/ [] Refused Recommended Disposition /[] [facility]/ []  Follow-up with PCP Additional Notes: Patient advised that he is "getting over a cold" and his right eye is crusty and this morning he had to wipe it with a warm washcloth. He states that the drainage of his eye is clear and yellowish. Patient states a coworker had the flu that he was around. Patient did endorse having some body aches. Patient states that his main concern was his right eye at this time.  He did mention that he has had some chest pain off and on for the past 3-4 days since he started feeling sick.  He said that it is mid sternal and when he coughs and clear up phlegm or clears his throat that pain is gone after that.  He denies any major cardiac or pulmonary history. Patient denies fever, nausea, vomiting, diarrhea, sore throat, difficulty breathing, chest pain at this time. Patient denies anything getting in his eye.  He wears glasses. Patient is given home care advice for both his eye and his chest. Appointment made for tomorrow at patient's PCP office with Dr. Joelle Musca at 8:15am. Patient is advised that if anything worsens to go to the emergency room.  Patient verbalized understanding.  Reason for Disposition   Yellow or green pus occurs  [1] Chest pain(s) lasting a few seconds AND [2] persists > 3 days  Answer Assessment - Initial Assessment Questions 1. LOCATION: "Where does it hurt?"       Center  but not at this time 2. RADIATION: "Does the pain go anywhere else?" (e.g., into neck, jaw, arms, back)     no 3. ONSET: "When did the chest pain begin?" (Minutes, hours or days)      3-4 days ago 4. PATTERN: "Does the pain come and go, or has it been constant since it started?"  "Does it get worse with exertion?"      Off and on----better after coughing 5. DURATION: "How long does it last" (e.g., seconds, minutes, hours)     Until coughing and clearing up some phlegm 6. SEVERITY: "How bad is the pain?"  (e.g., Scale 1-10; mild, moderate, or severe)    - MILD (1-3): doesn't interfere with normal activities     - MODERATE (4-7): interferes with normal activities or awakens from sleep    - SEVERE (8-10): excruciating pain, unable to do any normal activities       5 but then he coughs and it feels better and he goes back to sleep 7. CARDIAC RISK FACTORS: "Do you have any history of heart problems or risk factors for heart disease?" (e.g., angina, prior heart attack; diabetes, high blood pressure, high cholesterol, smoker, or strong family history of heart disease)     no 8. PULMONARY RISK FACTORS: "Do you have any history of lung disease?"  (e.g., blood clots in lung,  asthma, emphysema, birth control pills)     no 9. CAUSE: "What do you think is causing the chest pain?"     phlegm 10. OTHER SYMPTOMS: "Do you have any other symptoms?" (e.g., dizziness, nausea, vomiting, sweating, fever, difficulty breathing, cough)       Dry cough  Protocols used: Eye Pain and Other Symptoms-A-AH, Chest Pain-A-AH

## 2023-06-04 NOTE — Telephone Encounter (Signed)
Noted. Appreciate Dr Let Joana Reamer seeing pt.

## 2023-06-05 ENCOUNTER — Encounter: Payer: Self-pay | Admitting: Internal Medicine

## 2023-06-05 ENCOUNTER — Ambulatory Visit: Payer: BC Managed Care – PPO | Admitting: Internal Medicine

## 2023-06-05 VITALS — BP 110/62 | HR 70 | Temp 98.6°F | Ht 68.75 in | Wt 193.0 lb

## 2023-06-05 DIAGNOSIS — J0110 Acute frontal sinusitis, unspecified: Secondary | ICD-10-CM | POA: Diagnosis not present

## 2023-06-05 MED ORDER — DOXYCYCLINE HYCLATE 100 MG PO TABS: 100.0000 mg | ORAL_TABLET | Freq: Two times a day (BID) | ORAL | 0 refills | Status: AC

## 2023-06-05 MED ORDER — FLUTICASONE PROPIONATE 50 MCG/ACT NA SUSP: 2.0000 | Freq: Every day | NASAL | 1 refills | Status: AC

## 2023-06-05 NOTE — Progress Notes (Signed)
Subjective:    Patient ID: Jeff Mcclure, male    DOB: 2001-02-03, 23 y.o.   MRN: [id number]  HPI Here due to congestion and eye drainage With friend  2-3 days with runny eye on right Stuff coming out and crusted in the morning Hasn't been red  Congestion also for 3-4 days in throat and chest No fever Some post nasal drip Sore throat--then resolved No ear pain Some cough------dry No SOB No chills or sweat  Current Outpatient Medications on File Prior to Visit  Medication Sig Dispense Refill   albuterol (VENTOLIN HFA) 108 (90 Base) MCG/ACT inhaler Inhale 2 puffs into the lungs every 6 (six) hours as needed for wheezing or shortness of breath. 8.5 each 6   fluticasone (FLONASE) 50 MCG/ACT nasal spray SPRAY 2 SPRAYS INTO EACH NOSTRIL EVERY DAY 48 mL 1   albuterol (ACCUNEB) 0.63 MG/3ML nebulizer solution INHALE 1 VIAL BY NEBULIZATION EVERY 6 (SIX) HOURS AS NEEDED. (Patient not taking: Reported on 06/05/2023) 75 mL 2   No current facility-administered medications on file prior to visit.    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   Aspirin Nausea And Vomiting   Penicillins Nausea And Vomiting    Past Medical History:  Diagnosis Date   Acute otitis media of right ear with perforated tympanic membrane 04/21/2015   Ankle fracture, right 10/08/2012   Jeff Mcclure fracture of distal tibial epiphysis s/p boot   COVID-19 06/06/2022   Mild intermittent asthma    Osgood-Schlatter's disease of right knee 01/09/2013   Completed [facility] PT 01/2014 with HEP in place    Pneumonia 12/24/2010    Past Surgical History:  Procedure Laterality Date   TYMPANOSTOMY Bilateral 2011    Family History  Problem Relation Age of Onset   Asthma Mother    Hypothyroidism Mother    Hypertension Mother    Hyperlipidemia Mother    Diabetes Mother        borderline   Cancer Maternal Grandmother        thyroid   Coronary artery disease Neg Hx    Stroke  Neg Hx     Social History   Socioeconomic History   Marital status: Single    Spouse name: Not on file   Number of children: Not on file   Years of education: Not on file   Highest education level: Not on file  Occupational History   Not on file  Tobacco Use   Smoking status: Every Day    Types: Cigarettes    Passive exposure: Yes   Smokeless tobacco: Never   Tobacco comments:    Marijuana  Substance and Sexual Activity   Alcohol use: No    Alcohol/week: 0.0 standard drinks of alcohol   Drug use: Yes    Types: Marijuana   Sexual activity: Not on file  Other Topics Concern   Not on file  Social History Narrative   Lives with mom and grandmother   Father in [place]   Will attend [employer] at [employer] football, bikes, swimming, video games.   No smokers at home.   Wears helmet.   Favorite food - rice and beans.  Good fruits and vegetables.     Social Drivers of Corporate investment banker Strain: Not on file  Food Insecurity: Not on file  Transportation Needs: Not on file  Physical Activity: Not on file  Stress: Not on file  Social Connections: Not on file  Intimate Partner Violence: Not on file   Review of Systems No flu vaccine Taste decreased--smell down due to congestion in nose No N/V Eating okay Uses flonase regularly     Objective:   Physical Exam Constitutional:      Appearance: Normal appearance.  HENT:     Head:     Comments: No sinus tenderness    Right Ear: Tympanic membrane and ear canal normal.     Left Ear: Tympanic membrane and ear canal normal.     Mouth/Throat:     Pharynx: No oropharyngeal exudate or posterior oropharyngeal erythema.  Eyes:     General:        Right eye: No discharge.        Left eye: No discharge.     Conjunctiva/sclera: Conjunctivae normal.  Pulmonary:     Effort: Pulmonary effort is normal.     Breath sounds: No rales.     Comments: Normal expiratory phase Very slight exp wheeze--but not  tight Musculoskeletal:     Cervical back: Neck supple.  Lymphadenopathy:     Cervical: No cervical adenopathy.  Neurological:     Mental Status: He is alert.            Assessment & Plan:

## 2023-06-05 NOTE — Assessment & Plan Note (Signed)
Eye drainage seems to be from sinuses Likely still just viral Will refill his fluticasone If worsens, would start doxy 100 bid x 7 days
